# Patient Record
Sex: Female | Born: 2000 | Race: White | Hispanic: Yes | Marital: Single | Smoking: Never smoker
Health system: Southern US, Community
[De-identification: ages and names within clinical notes are randomized; demographics above are authoritative.]

---
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
## 2020-03-12 DIAGNOSIS — Z20822 Contact with and (suspected) exposure to covid-19: Secondary | ICD-10-CM | POA: Diagnosis not present

## 2020-06-06 IMAGING — US ULTRASOUND ABDOMEN COMPLETE
1 series · 13 of 25 positions shown · non-contrast
Comparison: None.

CLINICAL DATA: Fatty infiltration of the liver.

EXAM:
ABDOMEN ULTRASOUND COMPLETE

[Series 1: ultrasound abdomen complete · 13 of 111 slices shown]
[im 1/111]
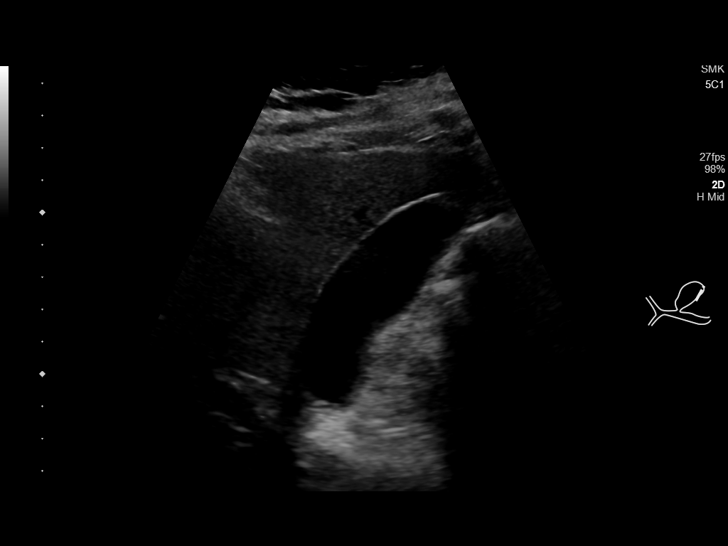
[im 10/111]
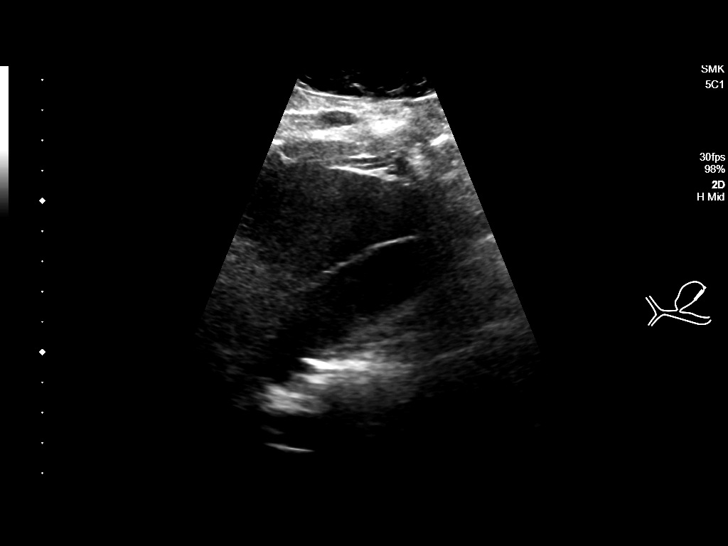
[im 19/111]
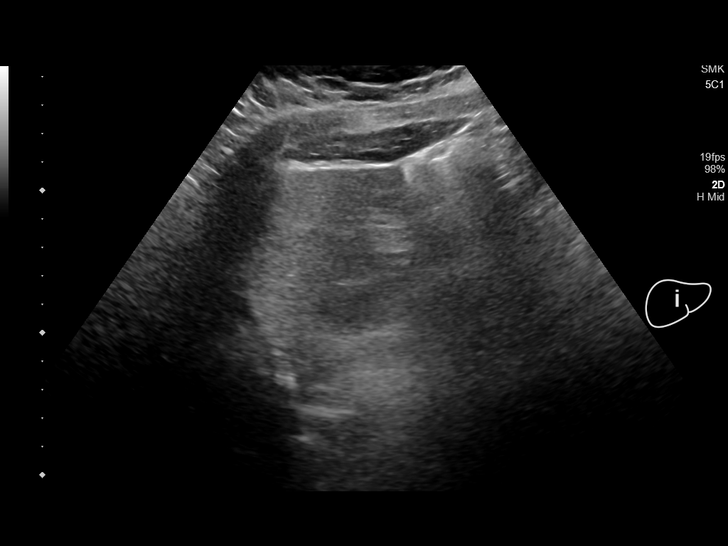
[im 28/111]
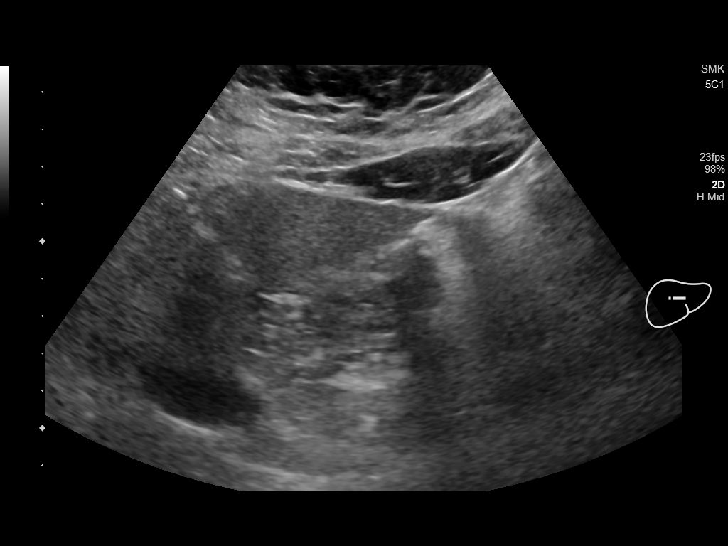
[im 37/111]
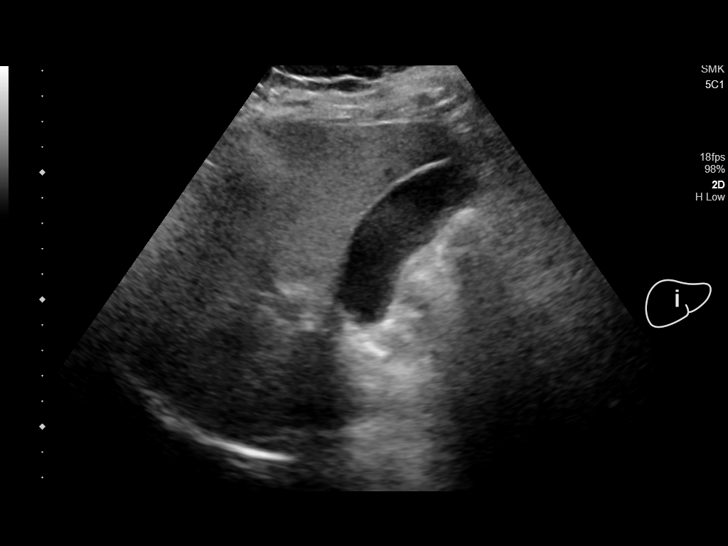
[im 46/111]
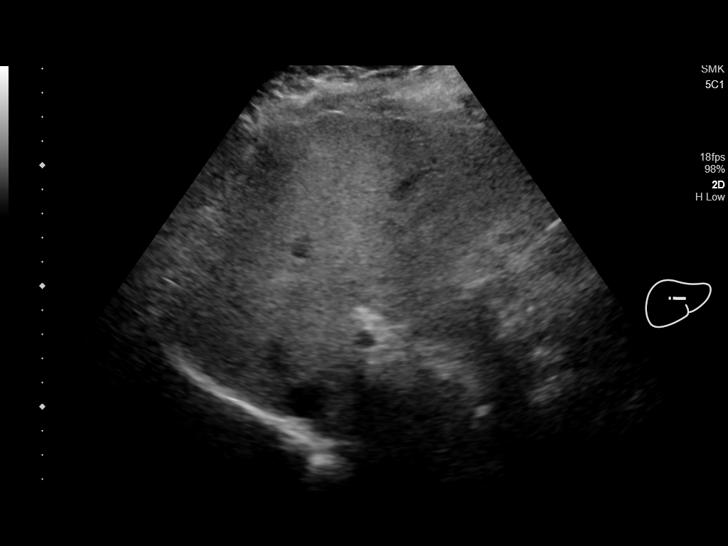
[im 56/111]
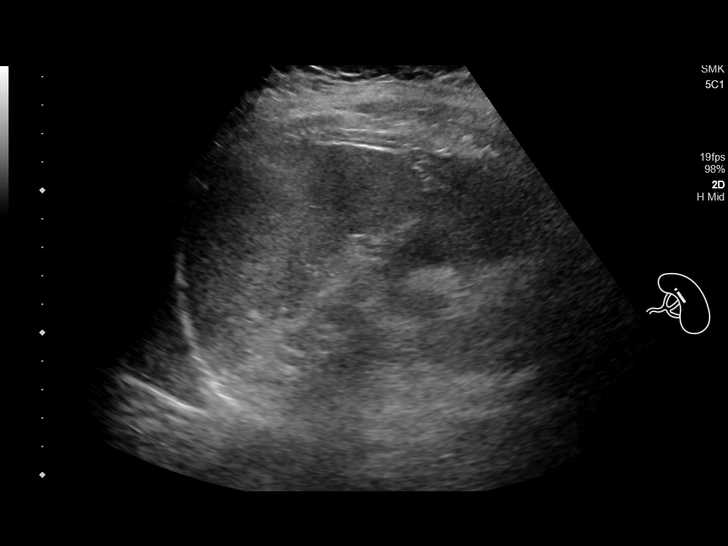
[im 65/111]
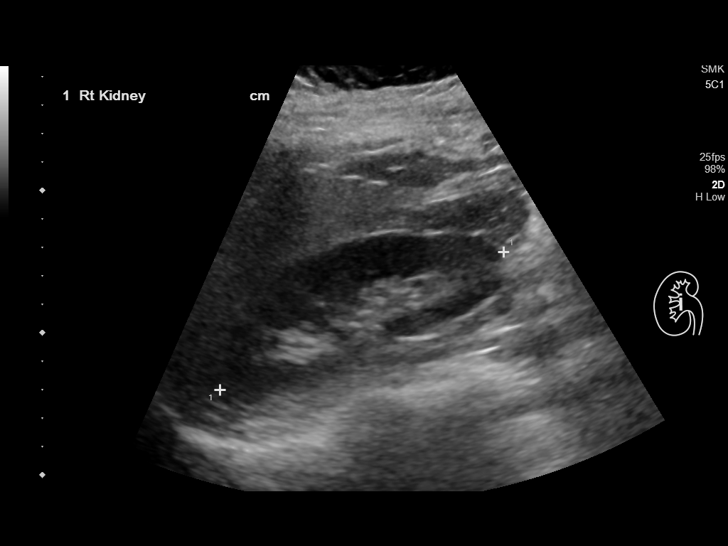
[im 74/111]
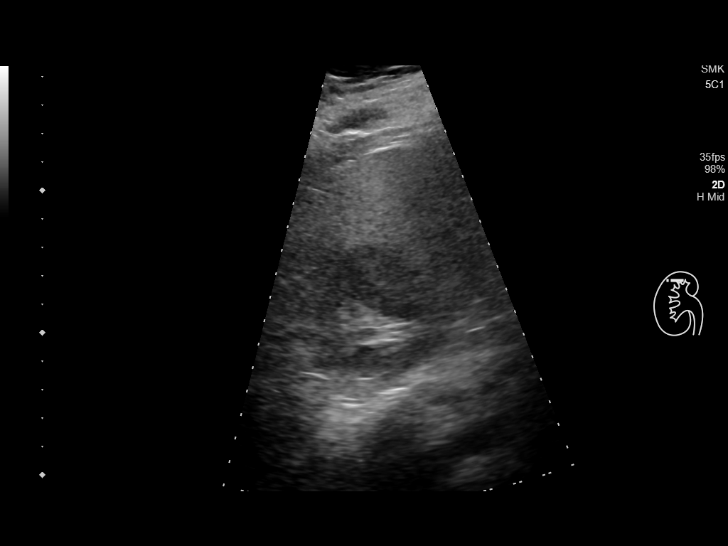
[im 83/111]
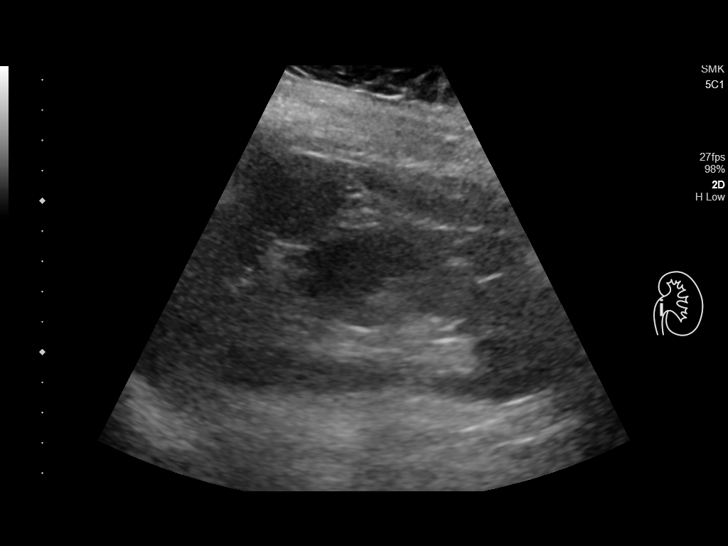
[im 92/111]
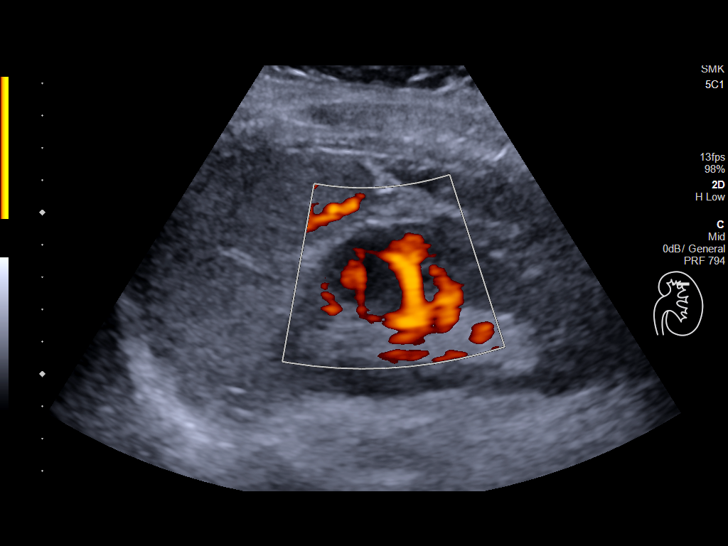
[im 101/111]
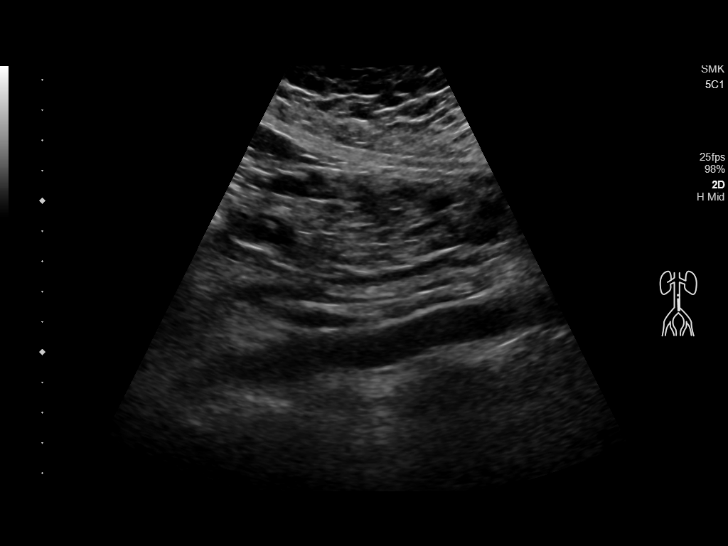
[im 111/111]
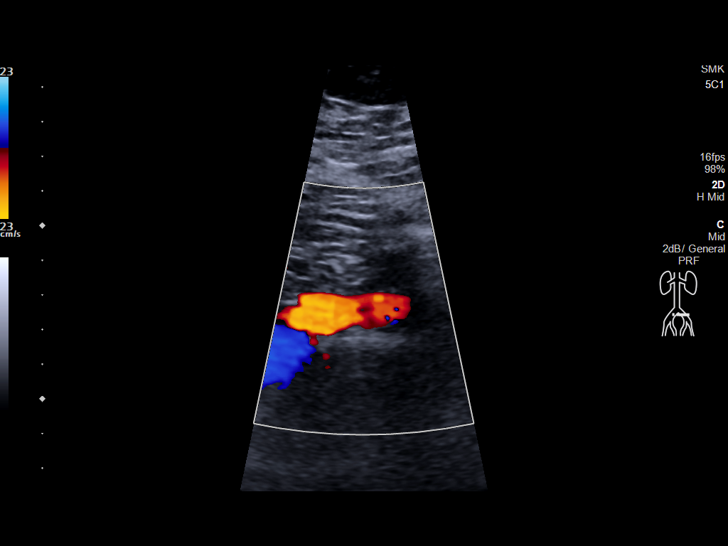

[13 of 25 positions shown; findings below may reference images not displayed]

FINDINGS: Gallbladder: No gallstones or wall thickening visualized. No
sonographic Murphy sign noted by sonographer.

Common bile duct: Diameter:

Liver: The liver is diffusely echogenic. No focal lesions are
present. Liver edge is smooth. Portal vein is patent on color
Doppler imaging with normal direction of blood flow towards the
liver.

IVC: No abnormality visualized.

Pancreas: Not visualized due to overlying bowel gas

Spleen: Size and appearance within normal limits.

Right Kidney: Length: 11.1 cm, within normal limits. Echogenicity
within normal limits. No mass or hydronephrosis visualized.

Left Kidney: Length: 10.3 cm, within normal limits. Echogenicity is
within normal limits. A hypoechoic mass lesion is present near the
upper pole of the right left kidney measuring 3.7 x 2.2 x 2.0 cm.
There is increased vascularity to the lesion.

Abdominal aorta: No aneurysm visualized.

Other findings: None.
IMPRESSION: 1. Left upper pole renal mass with increased vascularity. This is
concerning for a renal cell carcinoma. Recommend CT or MRI of the
kidneys without and with contrast for further evaluation.
2. Hepatic steatosis.

These results will be called to the ordering clinician or
representative by the Radiologist Assistant, and communication
documented in the PACS or zVision Dashboard.

## 2020-11-10 DIAGNOSIS — R1903 Right lower quadrant abdominal swelling, mass and lump: Secondary | ICD-10-CM | POA: Diagnosis not present

## 2020-11-10 DIAGNOSIS — L700 Acne vulgaris: Secondary | ICD-10-CM | POA: Diagnosis not present

## 2020-11-10 DIAGNOSIS — L309 Dermatitis, unspecified: Secondary | ICD-10-CM | POA: Diagnosis not present

## 2020-11-11 DIAGNOSIS — R1903 Right lower quadrant abdominal swelling, mass and lump: Secondary | ICD-10-CM | POA: Diagnosis not present

## 2021-01-27 DIAGNOSIS — H5213 Myopia, bilateral: Secondary | ICD-10-CM | POA: Diagnosis not present

## 2021-08-11 ENCOUNTER — Ambulatory Visit: Payer: Medicaid Other

## 2021-08-11 ENCOUNTER — Ambulatory Visit (LOCAL_COMMUNITY_HEALTH_CENTER): Payer: Medicaid Other | Admitting: Advanced Practice Midwife

## 2021-08-11 VITALS — BP 109/67 | Ht 61.0 in | Wt 172.4 lb

## 2021-08-11 DIAGNOSIS — Z3009 Encounter for other general counseling and advice on contraception: Secondary | ICD-10-CM

## 2021-08-11 DIAGNOSIS — Z3202 Encounter for pregnancy test, result negative: Secondary | ICD-10-CM

## 2021-08-11 DIAGNOSIS — Z309 Encounter for contraceptive management, unspecified: Secondary | ICD-10-CM

## 2021-08-11 DIAGNOSIS — E669 Obesity, unspecified: Secondary | ICD-10-CM

## 2021-08-11 LAB — WET PREP FOR TRICH, YEAST, CLUE: Trichomonas Exam: NEGATIVE

## 2021-08-11 LAB — HM HIV SCREENING LAB: HM HIV Screening: NEGATIVE

## 2021-08-11 LAB — PREGNANCY, URINE: Preg Test, Ur: NEGATIVE

## 2021-08-11 LAB — HEMOGLOBIN, FINGERSTICK: Hemoglobin: 13.2 g/dL (ref 11.1–15.9)

## 2021-08-11 NOTE — Progress Notes (Signed)
Patient here for STD, pap smear and PE. Wet prep reviewed, no tx per standing orders.  ?

## 2021-08-11 NOTE — Progress Notes (Signed)
[facility] DEPARTMENT ?[facility] ?[address] [employer] ?Main Number: [phone] ? ? ? ?Family Planning Visit- Initial Visit ? ?Subjective:  ?Caroline Wagner is a 21 y.o. SHF nullip nonsmoker [id number]   being seen today for an initial annual visit and to discuss reproductive life planning.  The patient is currently using No Method - Other Reason for pregnancy prevention. Patient reports   does not want a pregnancy in the next year.   ? ? report they are looking for a method that provides Other don't want any birth control ? ?Patient has the following medical conditions has Obesity BMI=32.5 on their problem list. ? ?Chief Complaint  ?Patient presents with  ? Annual Exam  ?  PE and Pap smear  ? ? ?Patient reports here for physical and pap and c/o "bumps" since 04/2021 in lower abdomen on left increasing in size and painful, aggravated when carrying items. [facility] in [place] ordered u/s 05/2021 and no dx or plan. LMP 06/13/21. Last sex 06/22/21 without condom; with current partner x 2 1/2 years; 1 partner in last 3 mo. Last dental exam 2022. Never had pap. 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 +cry 1x/mo, poor sleep, appetite wnl, low energy level, +moody, irritable, -anhedonia, -SI/HI. Wants counseling. Also c/o burning after sex and after voiding immediately after sex, as well as painful sex. Admits to "rough sex" and no foreplay.  Last MJ 2020. ? ?Patient denies cigs, vaping, cigars, ETOH ? ?Body mass index is 32.57 kg/m?. - Patient is eligible for diabetes screening based on BMI and age >85?  not applicable ?HA1C ordered? not applicable ? ?Patient reports 1  partner/s in last year. Desires STI screening?  Yes ? ?Has patient been screened once for HCV in the past?  No ? No results found for: HCVAB ? ?Does the patient have current drug use (including MJ), have a partner with drug use, and/or has been incarcerated since  last result? No  ?If yes-- Screen for HCV through [facility] ?  ?Does the patient meet criteria for HBV testing? No ? ?Criteria:  ?-Household, sexual or needle sharing contact with HBV ?-History of drug use ?-HIV positive ?-Those with known Hep C ? ? ?Health Maintenance Due  ?Topic Date Due  ? COVID-19 Vaccine (1) Never done  ? HPV VACCINES (1 - 2-dose series) Never done  ? HIV Screening  Never done  ? Hepatitis C Screening  Never done  ? TETANUS/TDAP  Never done  ? PAP-Cervical Cytology Screening  Never done  ? PAP SMEAR-Modifier  Never done  ? ? ?Review of Systems  ?Constitutional:  Positive for weight loss (gained 20 lbs in 2021; not exercising).  ?Genitourinary:  Positive for dysuria (burns with voiding only right after sex).  ? ?The following portions of the patient's history were reviewed and updated as appropriate: allergies, current medications, past family history, past medical history, past social history, past surgical history and problem list. Problem list updated. ? ? ?See flowsheet for other program required questions. ? ?Objective:  ? ?Vitals:  ? 08/11/21 1047  ?BP: 109/67  ?Weight: 172 lb 6.4 oz (78.2 kg)  ?Height: 5\' 1"  (1.549 m)  ? ? ?Physical Exam ?Constitutional:   ?   Appearance: Normal appearance. She is obese.  ?HENT:  ?   Head: Normocephalic and atraumatic.  ?   Mouth/Throat:  ?   Mouth: Mucous membranes are moist.  ?  Eyes:  ?   Conjunctiva/sclera: Conjunctivae normal.  ?Neck:  ?   Thyroid: No thyroid mass, thyromegaly or thyroid tenderness.  ?Cardiovascular:  ?   Rate and Rhythm: Normal rate and regular rhythm.  ?Pulmonary:  ?   Effort: Pulmonary effort is normal.  ?   Breath sounds: Normal breath sounds.  ?Chest:  ?Breasts: ?   Right: Normal.  ?   Left: Normal.  ?Abdominal:  ?   Palpations: Abdomen is soft.  ?   Comments: Soft, poor tone, increased adipose, pt c/o pain on palpation of lower abdomen because of "bumps" there that are growing and hurting since 04/2021. Had u/s 05/2021 with no  plan or dx  ?Genitourinary: ?   General: Normal vulva.  ?   Exam position: Lithotomy position.  ?   Vagina: Vaginal discharge (small amt white creamy leukorrhea, ph<4.5) present.  ?   Cervix: Normal.  ?   Uterus: Enlarged (sl enlarged ~12 wks size; ?fibroids?).   ?   Rectum: Normal.  ?   Comments: Pap done ?Unable to assess uterus or ovaries well due to pt discomfort and anxiety. Pt states even me touching vulva hurts, as well as speculum and cultures, -CMT ?Musculoskeletal:     ?   General: Normal range of motion.  ?   Cervical back: Normal range of motion and neck supple.  ?Skin: ?   General: Skin is warm and dry.  ?Neurological:  ?   Mental Status: She is alert.  ?Psychiatric:     ?   Mood and Affect: Mood normal.  ? ? ? ? ?Assessment and Plan:  ?Caroline Wagner is a 21 y.o. female presenting to the [facility] Department for an initial annual wellness/contraceptive visit ? ?Contraception counseling: Reviewed options based on patient desire and reproductive life plan. Patient is interested in No Method - No Contraceptive Precautions. This was provided to the patient today.  if not why not clearly documented ? ?Risks, benefits, and typical effectiveness rates were reviewed.  Questions were answered.  Written information was also given to the patient to review.   ? ?The patient will follow up in  prn  for surveillance.  The patient was told to call with any further questions, or with any concerns about this method of contraception.  Emphasized use of condoms 100% of the time for STI prevention. ? ?Need for ECP was assessed. Patient reported not interested in ECP  Reviewed options and patient desired No method of ECP, declined all   ? ?1. Obesity, unspecified classification, unspecified obesity type, unspecified whether serious comorbidity present ? ? ?2. Family planning ?Treat wet mount per standing orders ?Immunization nurse consult ?Referral made to [facility] clinic for dx and plan of  enlarging "bumps" that are painful onset 04/2021 in low abdomen with enlarged uterus ?Please give contact info for Kathreen Cosier, LCSW ?Please give dental list to pt ?Please give primary care MD list to pt ? ?- WET PREP FOR TRICH, YEAST, CLUE ?- Pregnancy, urine ?- Hemoglobin, venipuncture ?- Chlamydia/Gonorrhea [place] [facility] ?- Pap IG (Image Guided) ?- HIV [place] [facility] ?- Syphilis Serology, [facility] ? ? ? ? ?No follow-ups on file. ? ?No future appointments. ? ?Alberteen Spindle, CNM ?

## 2021-08-17 LAB — PAP IG (IMAGE GUIDED): PAP Smear Comment: 0

## 2021-08-23 NOTE — Progress Notes (Signed)
Pt left the clinic without bring back the results of her wet prep.  Pt needs to be treated for a yeast infection.  Pt also needs to be notified that a referral has been made to [facility] and the should reach out to her regarding a future appointment.  I have called pt x 3 and her number has been disconnected.  ?

## 2021-09-14 DIAGNOSIS — N852 Hypertrophy of uterus: Secondary | ICD-10-CM | POA: Diagnosis not present

## 2021-09-17 DIAGNOSIS — F411 Generalized anxiety disorder: Secondary | ICD-10-CM | POA: Diagnosis not present

## 2021-09-17 DIAGNOSIS — K029 Dental caries, unspecified: Secondary | ICD-10-CM | POA: Diagnosis not present

## 2021-09-17 DIAGNOSIS — E6609 Other obesity due to excess calories: Secondary | ICD-10-CM | POA: Diagnosis not present

## 2021-09-17 DIAGNOSIS — Z0000 Encounter for general adult medical examination without abnormal findings: Secondary | ICD-10-CM | POA: Diagnosis not present

## 2021-09-17 DIAGNOSIS — B379 Candidiasis, unspecified: Secondary | ICD-10-CM | POA: Diagnosis not present

## 2021-09-17 DIAGNOSIS — M545 Low back pain, unspecified: Secondary | ICD-10-CM | POA: Diagnosis not present

## 2021-09-17 DIAGNOSIS — Z6830 Body mass index (BMI) 30.0-30.9, adult: Secondary | ICD-10-CM | POA: Diagnosis not present

## 2021-10-20 DIAGNOSIS — K640 First degree hemorrhoids: Secondary | ICD-10-CM | POA: Diagnosis not present

## 2022-02-01 DIAGNOSIS — M7711 Lateral epicondylitis, right elbow: Secondary | ICD-10-CM | POA: Diagnosis not present

## 2022-02-07 DIAGNOSIS — M25521 Pain in right elbow: Secondary | ICD-10-CM | POA: Diagnosis not present

## 2022-02-15 DIAGNOSIS — M25529 Pain in unspecified elbow: Secondary | ICD-10-CM | POA: Diagnosis not present

## 2022-02-17 DIAGNOSIS — M25521 Pain in right elbow: Secondary | ICD-10-CM | POA: Diagnosis not present

## 2022-02-17 DIAGNOSIS — M7711 Lateral epicondylitis, right elbow: Secondary | ICD-10-CM | POA: Diagnosis not present

## 2022-02-17 DIAGNOSIS — M7712 Lateral epicondylitis, left elbow: Secondary | ICD-10-CM | POA: Diagnosis not present

## 2022-03-29 DIAGNOSIS — M25522 Pain in left elbow: Secondary | ICD-10-CM | POA: Diagnosis not present

## 2022-03-29 DIAGNOSIS — M25521 Pain in right elbow: Secondary | ICD-10-CM | POA: Diagnosis not present

## 2022-09-29 ENCOUNTER — Ambulatory Visit: Payer: Self-pay

## 2022-09-29 ENCOUNTER — Ambulatory Visit
Admission: EM | Admit: 2022-09-29 | Discharge: 2022-09-29 | Disposition: A | Discharge status: Home / Self Care | Payer: Medicaid Other

## 2022-09-29 DIAGNOSIS — J069 Acute upper respiratory infection, unspecified: Secondary | ICD-10-CM | POA: Diagnosis not present

## 2022-09-29 NOTE — ED Triage Notes (Signed)
Patient to Urgent Care with complaints of nasal congestion/ cough/ possible fevers that started on Monday. Denies any sick contacts.   Has been drinking tea w/ lemon.

## 2022-09-29 NOTE — ED Provider Notes (Signed)
Renaldo Fiddler    CSN: [id number] Arrival date & time: 09/29/22  1605      History   Chief Complaint Chief Complaint  Patient presents with   Nasal Congestion   Cough    HPI Caroline Wagner is a 22 y.o. female.  Patient presents with 3 day history of congestion and cough.  No OTC medications taken.  No fever, rash, ear pain, sore throat, shortness of breath, vomiting, diarrhea, or other symptoms.  Her medical history includes obesity.  The history is provided by the patient and medical records.    History reviewed. No pertinent past medical history.  Patient Active Problem List   Diagnosis Date Noted   Obesity BMI=32.5 08/11/2021    History reviewed. No pertinent surgical history.  OB History     Gravida  0   Para  0   Term  0   Preterm  0   AB  0   Living  0      SAB  0   IAB  0   Ectopic  0   Multiple  0   Live Births  0            Home Medications    Prior to Admission medications   Not on File    Family History Family History  Problem Relation Age of Onset   Diabetes Mother    Hypertension Mother    Diabetes Father    Kidney disease Father    Tuberculosis Sister    Tuberculosis Brother     Social History Social History   Tobacco Use   Smoking status: Never   Smokeless tobacco: Never  Vaping Use   Vaping Use: Never used  Substance Use Topics   Alcohol use: Never   Drug use: Not Currently    Types: Marijuana    Comment: last use 2020     Allergies   Patient has no known allergies.   Review of Systems Review of Systems  Constitutional:  Negative for chills and fever.  HENT:  Positive for congestion. Negative for ear pain and sore throat.   Respiratory:  Positive for cough. Negative for shortness of breath.   Cardiovascular:  Negative for chest pain and palpitations.  Gastrointestinal:  Negative for abdominal pain, diarrhea and vomiting.  Skin:  Negative for color change and rash.  All other  systems reviewed and are negative.    Physical Exam Triage Vital Signs ED Triage Vitals  Enc Vitals Group     BP      Pulse      Resp      Temp      Temp src      SpO2      Weight      Height      Head Circumference      Peak Flow      Pain Score      Pain Loc      Pain Edu?      Excl. in GC?    No data found.  Updated Vital Signs BP 122/85   Pulse 100   Temp 98.3 F (36.8 C)   Resp 18   SpO2 96%   Visual Acuity Right Eye Distance:   Left Eye Distance:   Bilateral Distance:    Right Eye Near:   Left Eye Near:    Bilateral Near:     Physical Exam Vitals and nursing note reviewed.  Constitutional:  General: She is not in acute distress.    Appearance: She is well-developed.  HENT:     Right Ear: Tympanic membrane normal.     Left Ear: Tympanic membrane normal.     Nose: Congestion and rhinorrhea present.     Mouth/Throat:     Mouth: Mucous membranes are moist.     Pharynx: Oropharynx is clear.  Cardiovascular:     Rate and Rhythm: Normal rate and regular rhythm.     Heart sounds: Normal heart sounds.  Pulmonary:     Effort: Pulmonary effort is normal. No respiratory distress.     Breath sounds: Normal breath sounds.  Musculoskeletal:     Cervical back: Neck supple.  Skin:    General: Skin is warm and dry.  Neurological:     Mental Status: She is alert.  Psychiatric:        Mood and Affect: Mood normal.        Behavior: Behavior normal.      UC Treatments / Results  Labs (all labs ordered are listed, but only abnormal results are displayed) Labs Reviewed - No data to display  EKG   Radiology No results found.  Procedures Procedures (including critical care time)  Medications Ordered in UC Medications - No data to display  Initial Impression / Assessment and Plan / UC Course  I have reviewed the triage vital signs and the nursing notes.  Pertinent labs & imaging results that were available during my care of the patient were  reviewed by me and considered in my medical decision making (see chart for details).    Viral URI.  Patient has been symptomatic for 3 days and has not taken any OTC medication.  Discussed symptomatic treatment including Mucinex and ibuprofen.  Work note provided per patient request.  Education provided on viral URI.  Instructed patient to follow up with her PCP if her symptoms are not improving.  She agrees to plan of care.    Final Clinical Impressions(s) / UC Diagnoses   Final diagnoses:  Viral URI     Discharge Instructions      Take Mucinex and ibuprofen as directed.  Follow up with your primary care provider if your symptoms are not improving.        ED Prescriptions   None    PDMP not reviewed this encounter.   Mickie Bail, NP 09/29/22 [phone]

## 2022-09-29 NOTE — Discharge Instructions (Addendum)
Take Mucinex and ibuprofen as directed.    Follow up with your primary care provider if your symptoms are not improving.    

## 2023-08-06 DIAGNOSIS — S0101XA Laceration without foreign body of scalp, initial encounter: Secondary | ICD-10-CM | POA: Diagnosis not present

## 2023-08-06 DIAGNOSIS — S0990XA Unspecified injury of head, initial encounter: Secondary | ICD-10-CM | POA: Diagnosis not present

## 2023-08-14 DIAGNOSIS — S0101XA Laceration without foreign body of scalp, initial encounter: Secondary | ICD-10-CM | POA: Diagnosis not present

## 2023-08-14 DIAGNOSIS — Z4802 Encounter for removal of sutures: Secondary | ICD-10-CM | POA: Diagnosis not present
